# Patient Record
Sex: Female | Born: 1935 | Race: Black or African American | Hispanic: No
Health system: Southern US, Community
[De-identification: ages and names within clinical notes are randomized; demographics above are authoritative.]

---

## 2004-07-15 ENCOUNTER — Ambulatory Visit: Payer: Self-pay | Admitting: Gastroenterology

## 2004-10-23 ENCOUNTER — Emergency Department: Payer: Self-pay | Admitting: Unknown Physician Specialty

## 2004-12-09 ENCOUNTER — Emergency Department: Payer: Self-pay | Admitting: Emergency Medicine

## 2005-03-10 ENCOUNTER — Inpatient Hospital Stay: Payer: Self-pay | Admitting: Internal Medicine

## 2005-03-10 ENCOUNTER — Other Ambulatory Visit: Payer: Self-pay

## 2005-03-11 ENCOUNTER — Other Ambulatory Visit: Payer: Self-pay

## 2005-10-03 ENCOUNTER — Emergency Department: Payer: Self-pay | Admitting: Emergency Medicine

## 2005-12-24 ENCOUNTER — Ambulatory Visit: Payer: Self-pay | Admitting: Family Medicine

## 2006-02-24 ENCOUNTER — Emergency Department: Payer: Self-pay | Admitting: Emergency Medicine

## 2006-09-02 ENCOUNTER — Other Ambulatory Visit: Payer: Self-pay

## 2006-09-02 ENCOUNTER — Inpatient Hospital Stay: Payer: Self-pay | Admitting: Internal Medicine

## 2006-09-06 ENCOUNTER — Other Ambulatory Visit: Payer: Self-pay

## 2007-04-01 ENCOUNTER — Inpatient Hospital Stay: Payer: Self-pay | Admitting: Internal Medicine

## 2007-04-01 ENCOUNTER — Other Ambulatory Visit: Payer: Self-pay

## 2007-05-29 ENCOUNTER — Ambulatory Visit: Payer: Self-pay

## 2008-07-06 ENCOUNTER — Ambulatory Visit: Payer: Self-pay

## 2008-09-11 IMAGING — CT CT HEAD WITHOUT CONTRAST
2 series · 15 of 30 positions shown, 19 images · non-contrast
Comparison: none

REASON FOR EXAM: Metal stus decline
COMMENTS:

[Series 2: without · axial · non-contrast · 0.39mm/px · z∈[+42,+162]mm · 13 of 29 slices shown, 17 images]
[im 3/29  brain]
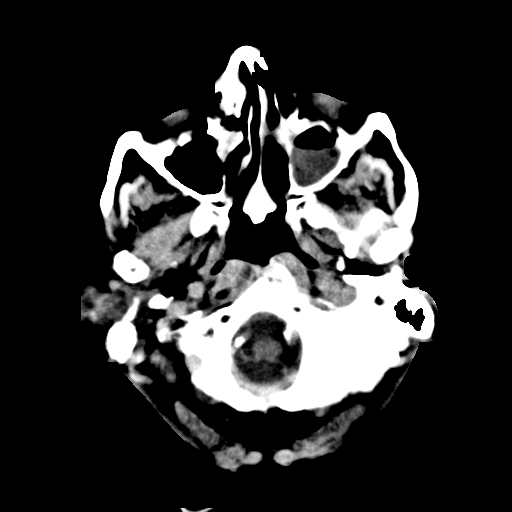
[im 3/29  bone]
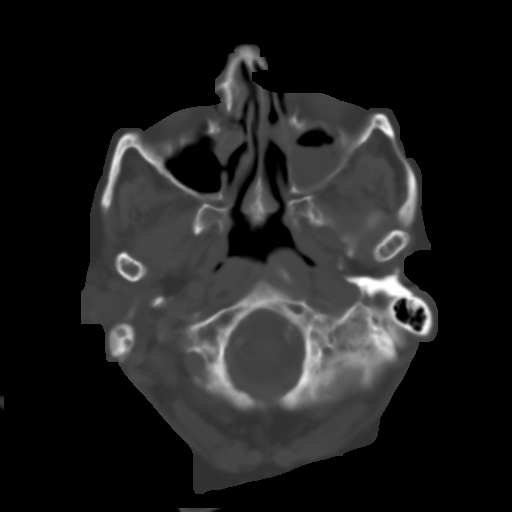
[im 5/29  brain]
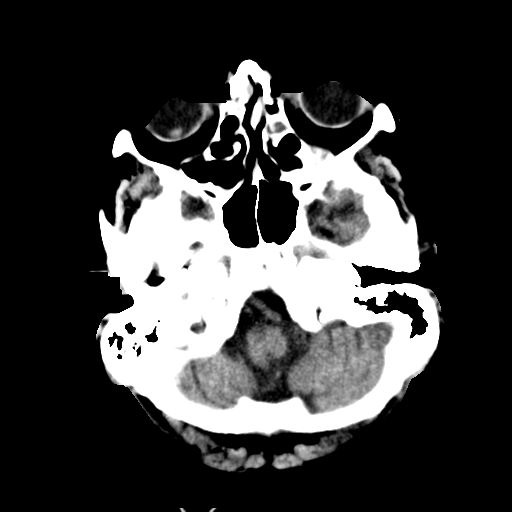
[im 7/29  brain]
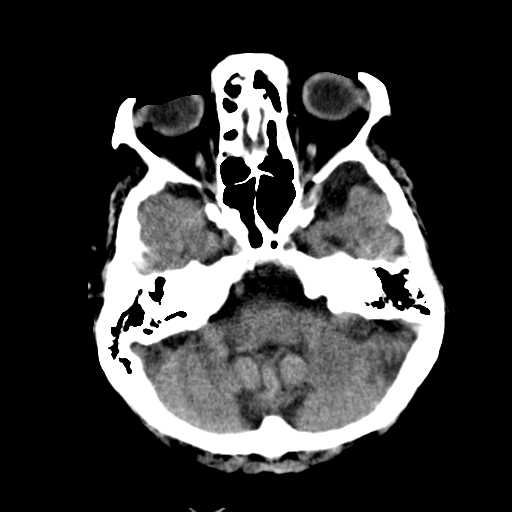
[im 9/29  brain]
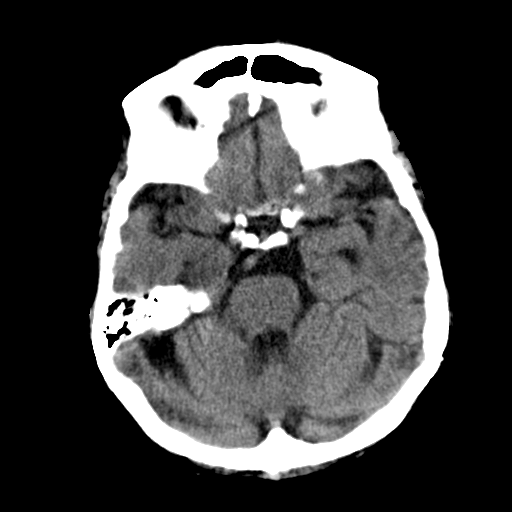
[im 11/29  brain]
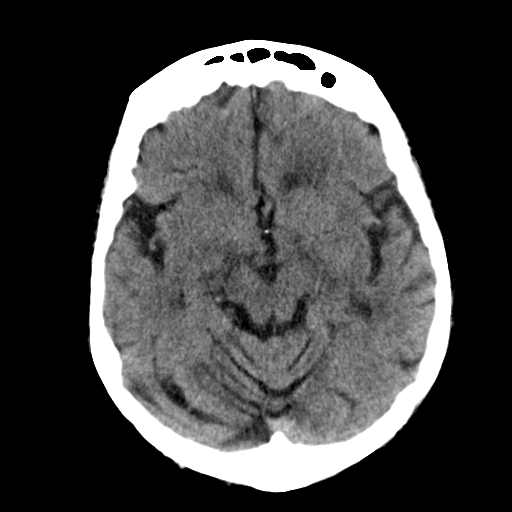
[im 11/29  bone]
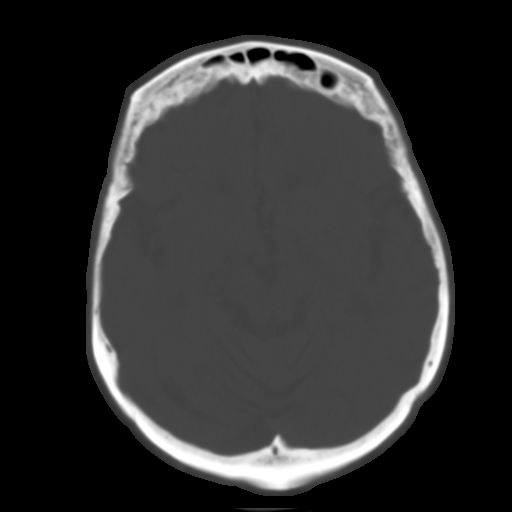
[im 13/29  brain]
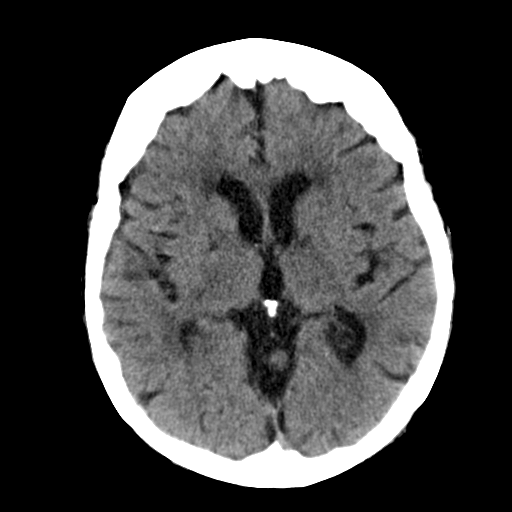
[im 15/29  brain]
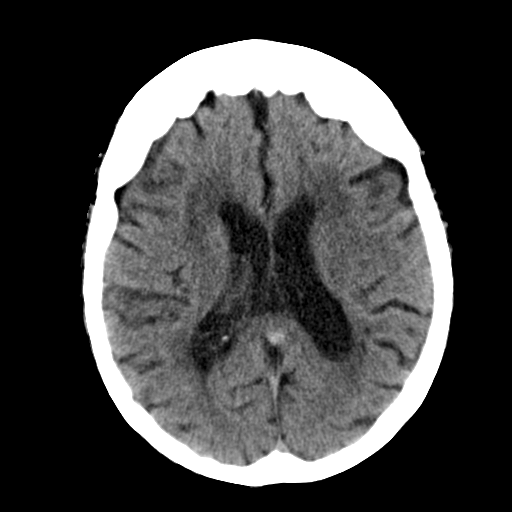
[im 17/29  brain]
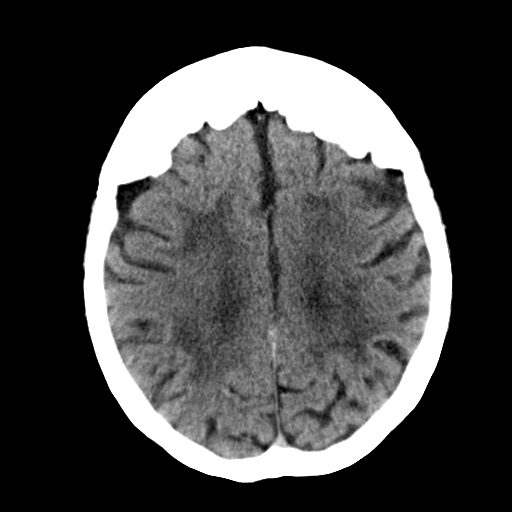
[im 19/29  brain]
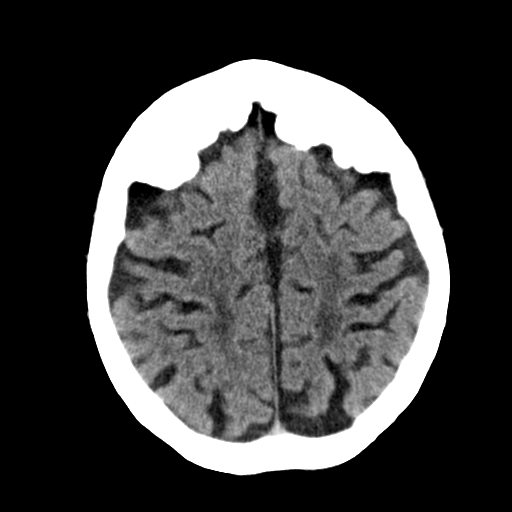
[im 19/29  bone]
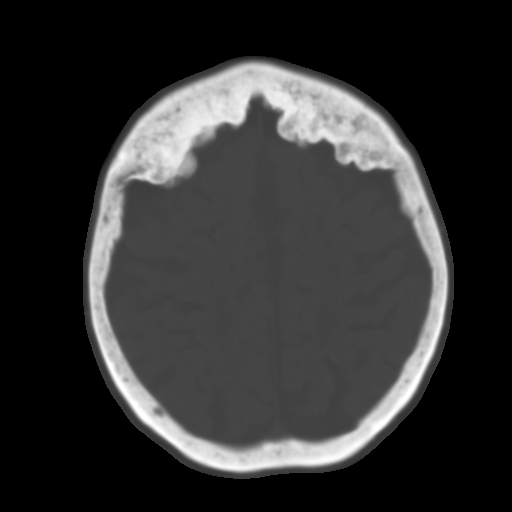
[im 21/29  brain]
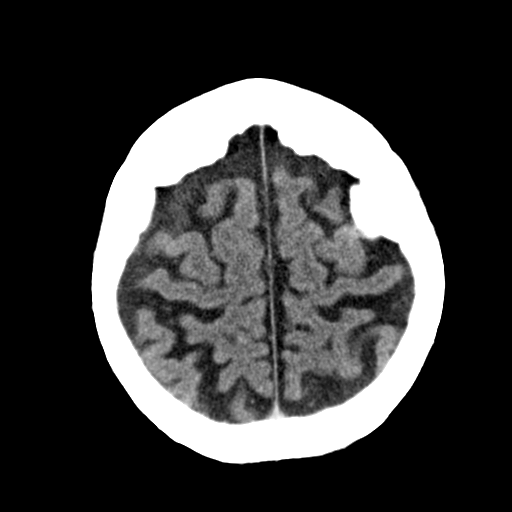
[im 23/29  brain]
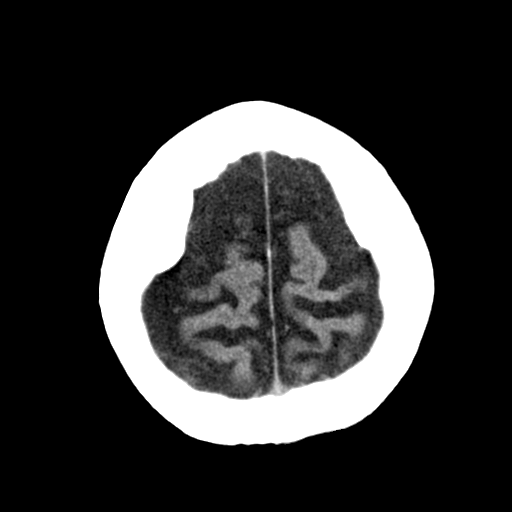
[im 25/29  brain]
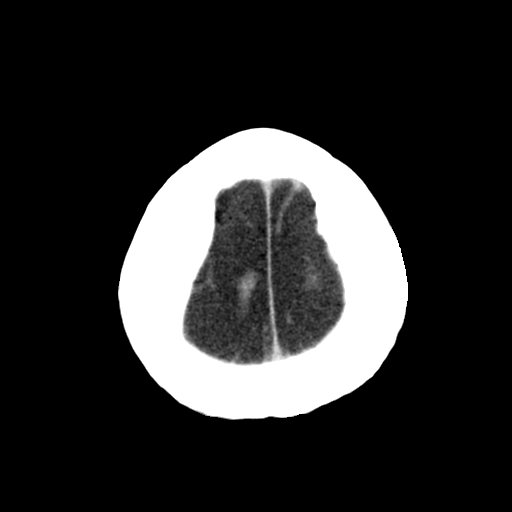
[im 27/29  brain]
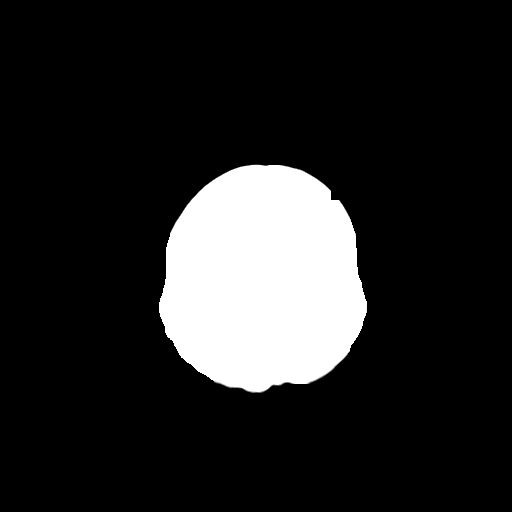
[im 27/29  bone]
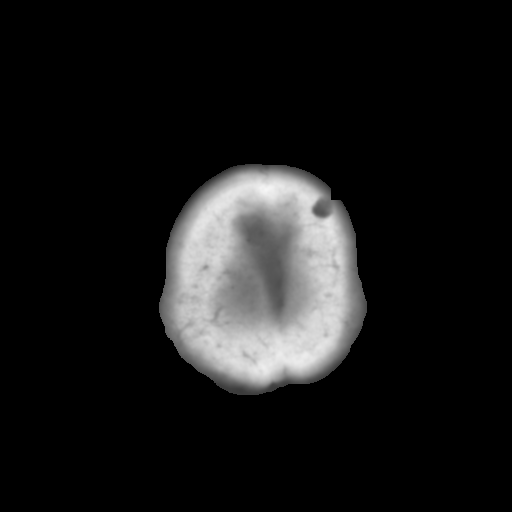

[Series 3: bone · axial · 0.39mm/px · z∈[+42,+62]mm · 2 of 29 slices shown]
[im 3/29  bone]
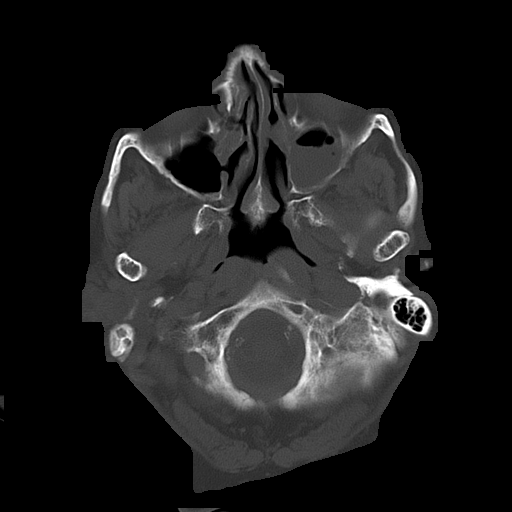
[im 7/29  bone]
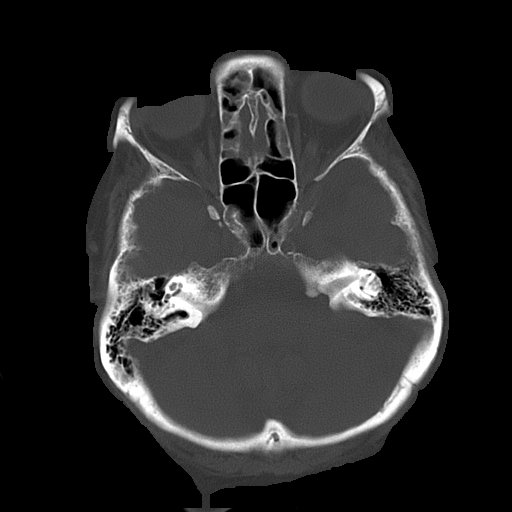

[15 of 30 positions shown; findings below may reference images not displayed]

PROCEDURE:     CT  - CT HEAD WITHOUT CONTRAST  - April 04, 2007  [DATE]

RESULT:     Noncontrast images of the head were obtained. There is no
evidence of intraaxial or extraaxial fluid collections or evidence of acute
hemorrhage. Diffuse cortical atrophy is appreciated. Areas of low
attenuation project within the subcortical deep and periventricular white
matter regions. Focal areas of low attenuation project within the medial
posterior portion of the basal ganglia on the LEFT and in a similar location
on the RIGHT. There is also evidence of cerebellar atrophy. Evaluation of
the visualized bony skeleton demonstrates no evidence of fracture or
dislocation. An air-fluid level is identified within the LEFT maxillary
sinus as well as areas of opacification in the ethmoid sinuses on the LEFT
and mucoperiosteal thickening within the nasal cavity.
IMPRESSION: 1.     Involutional changes.
2.     Areas of chronic infarction within the basal ganglia on the RIGHT and
LEFT.
3.     Findings which raise the suspicion of sinusitis involving the ethmoid
and maxillary sinuses on the LEFT.

## 2010-03-08 ENCOUNTER — Ambulatory Visit: Payer: Self-pay

## 2010-03-09 ENCOUNTER — Ambulatory Visit: Payer: Self-pay | Admitting: Geriatric Medicine

## 2011-09-22 ENCOUNTER — Ambulatory Visit: Payer: Self-pay | Admitting: Internal Medicine

## 2011-10-17 ENCOUNTER — Inpatient Hospital Stay: Payer: Self-pay | Admitting: Internal Medicine

## 2011-10-17 LAB — COMPREHENSIVE METABOLIC PANEL
Albumin: 3.4 g/dL (ref 3.4–5.0)
Alkaline Phosphatase: 80 U/L (ref 50–136)
Bilirubin,Total: 0.5 mg/dL (ref 0.2–1.0)
Calcium, Total: 9 mg/dL (ref 8.5–10.1)
Chloride: 106 mmol/L (ref 98–107)
EGFR (African American): 52 — ABNORMAL LOW
EGFR (Non-African Amer.): 43 — ABNORMAL LOW
Glucose: 111 mg/dL — ABNORMAL HIGH (ref 65–99)
Osmolality: 304 (ref 275–301)
Potassium: 4.1 mmol/L (ref 3.5–5.1)
SGPT (ALT): 20 U/L
Sodium: 145 mmol/L (ref 136–145)

## 2011-10-17 LAB — CBC
MCH: 28.3 pg (ref 26.0–34.0)
MCHC: 32.9 g/dL (ref 32.0–36.0)
MCV: 86 fL (ref 80–100)
Platelet: 150 10*3/uL (ref 150–440)

## 2011-10-17 LAB — URINALYSIS, COMPLETE
Glucose,UR: NEGATIVE mg/dL (ref 0–75)
Nitrite: NEGATIVE
Protein: NEGATIVE
RBC,UR: 63 /HPF (ref 0–5)
Specific Gravity: 1.009 (ref 1.003–1.030)
WBC UR: 365 /HPF (ref 0–5)

## 2011-10-17 LAB — PROTIME-INR
INR: 1.2
Prothrombin Time: 15.4 secs — ABNORMAL HIGH (ref 11.5–14.7)

## 2011-10-17 LAB — CK TOTAL AND CKMB (NOT AT ARMC): CK-MB: 3.5 ng/mL (ref 0.5–3.6)

## 2011-10-18 LAB — CBC WITH DIFFERENTIAL/PLATELET
Basophil #: 0 10*3/uL (ref 0.0–0.1)
Basophil %: 0.3 %
Eosinophil #: 0.1 10*3/uL (ref 0.0–0.7)
HCT: 34.1 % — ABNORMAL LOW (ref 35.0–47.0)
HGB: 11.2 g/dL — ABNORMAL LOW (ref 12.0–16.0)
Lymphocyte %: 17.5 %
MCHC: 32.9 g/dL (ref 32.0–36.0)
Monocyte %: 9.4 %
Neutrophil #: 5.5 10*3/uL (ref 1.4–6.5)
Platelet: 140 10*3/uL — ABNORMAL LOW (ref 150–440)
RDW: 14.4 % (ref 11.5–14.5)
WBC: 7.8 10*3/uL (ref 3.6–11.0)

## 2011-10-18 LAB — BASIC METABOLIC PANEL
Anion Gap: 12 (ref 7–16)
BUN: 43 mg/dL — ABNORMAL HIGH (ref 7–18)
Co2: 23 mmol/L (ref 21–32)
Creatinine: 1.12 mg/dL (ref 0.60–1.30)
EGFR (African American): 60 — ABNORMAL LOW
EGFR (Non-African Amer.): 49 — ABNORMAL LOW
Glucose: 84 mg/dL (ref 65–99)
Osmolality: 295 (ref 275–301)
Potassium: 4 mmol/L (ref 3.5–5.1)

## 2011-10-18 LAB — CK TOTAL AND CKMB (NOT AT ARMC): CK, Total: 64 U/L (ref 21–215)

## 2011-10-18 LAB — TROPONIN I: Troponin-I: 0.15 ng/mL — ABNORMAL HIGH

## 2011-10-18 LAB — PROTIME-INR
INR: 1.3
Prothrombin Time: 16.6 secs — ABNORMAL HIGH (ref 11.5–14.7)

## 2011-10-19 LAB — URINE CULTURE

## 2011-10-20 LAB — PROTIME-INR: Prothrombin Time: 18 secs — ABNORMAL HIGH (ref 11.5–14.7)

## 2011-10-23 ENCOUNTER — Ambulatory Visit: Payer: Self-pay | Admitting: Internal Medicine

## 2013-02-19 DEATH — deceased

## 2013-03-26 IMAGING — CR DG CHEST 1V PORT
1 series · 1 of 1 positions shown · non-contrast
Comparison: none

REASON FOR EXAM: new onset seizure
COMMENTS:

[portable]
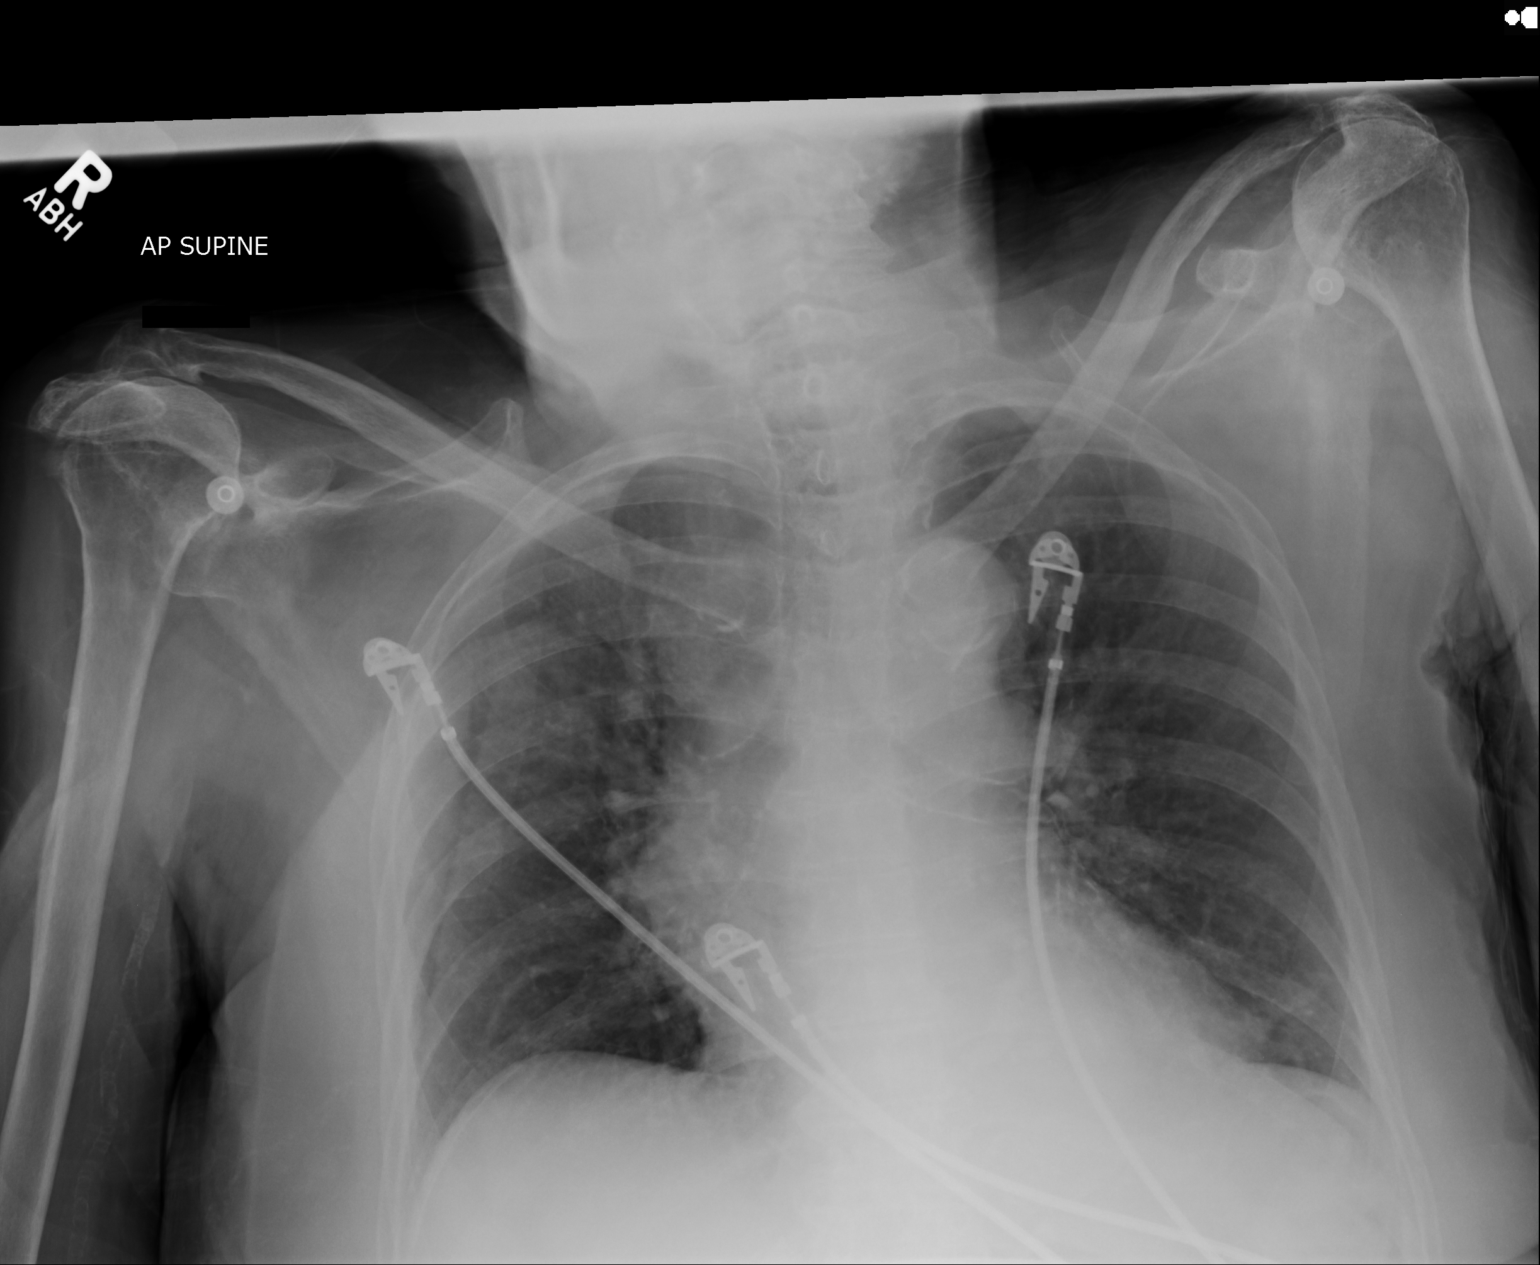

[1 of 1 positions shown; findings below may reference images not displayed]

PROCEDURE:     DXR - DXR PORTABLE CHEST SINGLE VIEW  - October 17, 2011 [DATE]

RESULT:     The patient has taken a shallow inspiration. With technique
taken into consideration, there is an area of increased density that
projects in the right upper lobe. There is also increased density in the
right paratracheal region. The visualized bony skeleton is unremarkable.
IMPRESSION: 1. Shallow inspiration.
2. Atelectasis versus infiltrate right upper lobe.
3. Right paratracheal density. This may be secondary to technique though a
mass or infiltrate cannot be excluded. Repeat evaluation is recommended.

## 2015-01-13 NOTE — Consult Note (Signed)
Comments   Met with Cristobella Rothgeb - pt's daughter-in-law who describes herself as pt's Media planner. Updated her on pt's status. She confirms pt now at baseline with mental status. Family very realistic that pt is likely approaching end of life and is at high risk for recurrent infections. Family wants pt to return to SNF and had explored option of hospice this past week. They are interested in hospice and will order a screening. Family would prefer comfort and end of life. Plan for eventual discharge back to SNF with hospice.  25 minutes  Electronic Signatures for Addendum Section:  Phifer, Izora Gala (MD) (Signed Addendum 28-Jan-13 20:09)  Discussed with with Billey Chang, NP, in detail. Agree with assessment and plan as outlined in above note.   Electronic Signatures: Borders, Kirt Boys (NP)  (Signed 28-Jan-13 10:15)  Authored: Palliative Care   Last Updated: 28-Jan-13 20:09 by Phifer, Izora Gala (MD)

## 2015-01-13 NOTE — H&P (Signed)
PATIENT NAME:  Darlene Warren, BEIL MR#:  [id number] DATE OF BIRTH:  11-24-1924  DATE OF ADMISSION:  10/17/2011  CHIEF COMPLAINT: Seizure.   HISTORY OF PRESENT ILLNESS: Darlene Warren is an 81 year old woman with endstage dementia who lives at [facility] nursing facility. She was in her usual state of health until today when she was observed by staff having what was thought to be a tonic-clonic seizure. The patient is unable to provide any history, and staff is unavailable to give any further information. She has no seizure history. She is accompanied in the Emergency Department by her daughter-in-law and sister who say that she is not quite back to her baseline. Her baseline is nonverbal, right arm tremor, nonambulatory, but typically alert. They have noticed that over the past several months her p.o. intake has declined.   REVIEW OF SYSTEMS: Unobtainable secondary to altered mental status and baseline dementia.   PAST MEDICAL HISTORY:  1. Endstage dementia.  2. Congestive heart failure.  3. Osteoarthritis.  4. Hypertension.   PRIMARY CARE PHYSICIAN: Dr. Caffie Damme   FAMILY HISTORY: Unobtainable secondary to dementia and altered mental status.   SOCIAL HISTORY: She lives at [facility]. No alcohol, tobacco, or illicit drugs.   CODE STATUS: DNR/DNI.   CONTACT PERSON AND POA:   Her sister, Dillon Bjork.  ALLERGIES: No known drug allergies.    MEDICATIONS:  1. Tylenol 325 mg, 2 tablets p.o. q.i.d.  2. Metoprolol 12.5 mg p.o. b.i.d.  3. Warfarin 4.5 mg p.o. at bedtime.  4. Furosemide 20 mg p.o. on Monday, Wednesday, and Friday.  5. Calcium carbonate 500 mg p.o. daily.  6. Aspirin 81 mg p.o. daily.  7. Amlodipine 5 mg p.o. daily   PHYSICAL EXAMINATION:  VITAL SIGNS: On admission, temperature 97.8, pulse 81, respiratory rate 20, blood pressure 148/74, oxygen saturation 95% on room air.   GENERAL: She is well-developed and well-nourished, and is in no acute distress   HEENT: Conjunctivae and lids  are without erythema or pallor. Pupils are equally round and reactive to light, though appear postsurgical. External inspection of the ears and nose is without lesions or masses. Hearing cannot be assessed. Nasal mucosa, septum, and turbinates are without lesions or masses. Lips and gums are without lesions or masses. She is edentulous. Oropharynx is clear. Oral mucosa is moist. Posterior pharynx is not well visualized.   NECK: Supple. There are no masses. Thyroid is not enlarged. There are no nodules.   RESPIRATORY: She is not in respiratory distress.   LUNGS: Lungs are clear throughout with no wheezes or crackles.   CARDIOVASCULAR: Normal S1 and S2. No murmurs, rubs, or gallops. Cardiac sounds are distant. Pedal pulses are 2+ and equal bilaterally.   ABDOMEN: Soft, nontender, nondistended. Liver and spleen are not enlarged Bowel sounds are hypoactive. No cervical or axillary lymphadenopathy.   EXTREMITIES: Digits and nails are without clubbing, cyanosis, or edema. Range of motion and strength cannot be assessed. She does seem to have some lower extremity spasticity.   SKIN: Skin and subcutaneous tissue are without rashes or ulcers. Palpation of skin reveals no induration or nodules. She does have a stage I sacral decubitus ulcer without surrounding erythema.   NEUROLOGIC: Cranial nerves II through XII are grossly intact, though assessment is limited secondary to cooperation. Judgment and insight are poor. The patient is not oriented.  LABORATORY, DIAGNOSTIC, AND RADIOLOGICAL DATA: White blood cell count is 7.9, hematocrit 40, platelets 150, sodium 145, potassium 4.1, chloride 106, bicarbonate 26, BUN  53, creatinine 1.3, which is her baseline. Glucose 111, INR 1.2, MB 3.5. CK is 58, troponin 0.09. CT of the head reveals no acute intracranial process. Chest x-ray is significant for right upper lobe infiltrate versus atelectasis. EKG is normal sinus rhythm with a first-degree AV block, left  ventricular hypertrophy, Q waves inferiorly, and T-wave inversions throughout the anterior lateral leads.   ASSESSMENT AND PLAN: 81 year old woman with endstage dementia who presents for possible witnessed seizure, found to have urinary tract infection and possible aspiration pneumonia.  1. Urinary tract infection: Urine culture is pending. She did receive a dose of ciprofloxacin in the Emergency Department, switched over to Keflex secondary to being on warfarin.  2. Aspiration pneumonia:  She did get a dose of azithromycin in the Emergency Department which will not be continued on the floor. She is on aspiration precautions and a swallow evaluation is ordered. Empirically ordered for a pureed diet. Chest x-ray read recommends followup to rule out occult malignancy, will not order given goals of care.  3. Possible witnessed seizure: She has no history of seizures, but she does have risk factors including prior stroke. We will not empirically treat with antiepileptics. Head CT is negative for acute process. We will continue to monitor.  4. Endstage dementia: She is DO NOT RESUSCITATE. Goals of care are comfort per family.  5. Troponinemia : This may be secondary to renal failure. We will trend her cardiac enzymes.  6. Chronic renal insufficiency: We will give a 500-mL bolus as she does look slightly dry, though her creatinine is baseline.  7. Deep vein thrombosis prophylaxis is per her Coumadin.   TOTAL TIME SPENT ON CARE AND COORDINATION: 45 minutes.   ____________________________ Lise AuerJennifer L. Manson PasseyBrown, MD jlb:bjt D:  10/17/2011 16:41:30 ET           T:  10/18/2011 08:23:52 ET        JOB#: [id number]  cc: Kerrin ChampagneJoan E. Caffie DammeEast, MD Eber HongJENNIFER L BROWN MD ELECTRONICALLY SIGNED 10/20/2011 13:37

## 2015-01-13 NOTE — Discharge Summary (Signed)
PATIENT NAME:  Darlene Warren, [name] MR#:  [id number] DATE OF BIRTH:  April 02, 1925  DATE OF ADMISSION:  10/17/2011 DATE OF DISCHARGE:  10/20/2011  DISCHARGE DIAGNOSES:  1. Altered mental status/metabolic encephalopathy, now at her baseline. Negative head computed tomography.  2. Extended-spectrum beta-lactamase urinary tract infection on Levaquin, day two out of 5 in isolation.  3. Questionable aspiration pneumonia present on admission which is ruled out now.  4. Possible witnessed seizure while at home; none reported here. She does have risk factor of prior stroke. She appeared to be at baseline. Will hold off antiepileptics at this time.  5. End-stage dementia, now under hospice.  6. Elevated troponin, likely due to supply/demand ischemia.  7. Dehydration with elevated BUN, now improved with hydration.  8. Hypertension, well controlled on medication.  9. Stage II pressure ulcer on the right buttock (1 cm x 0.5 cm in size). Dressing changes recommended. No signs of infection, well healing.   SECONDARY DIAGNOSES:  1. End-stage dementia.  2. History of hypertension.  3. Osteoarthritis.   CONSULTATION: Palliative care, Dr. Harvie JuniorPhifer.   LABORATORY, DIAGNOSTIC AND RADIOLOGICAL DATA:  CT scan of the head without contrast on 01/26 showed no acute abnormalities.   Chest x-ray on 01/26 showed atelectasis versus infiltrate in the right upper lobe. Right paratracheal density.   Urinalysis on admission showed 365 WBCs, 1+ bacteria, 3+ leukocyte esterase. Urine culture grew more than 100,000 colonies of ESBL Escherichia coli.   HISTORY AND SHORT HOSPITAL COURSE: Patient is an 81 year old female with above-mentioned medical problems was admitted for altered mental status/encephalopathy which was thought to be secondary to urinary tract infection. Her urine culture was growing ESBL Escherichia coli. She was also thought to have possible aspiration pneumonia which was subsequently ruled out as she did not have any  upper respiratory tract infection symptoms. There was also concern for possible witnessed seizure although considering her age and comorbid medical condition with end-stage dementia she was not considered for any antiepileptic medication. After discussion with patient's family member, palliative care consult was obtained and patient was considered for hospice due to her end-stage dementia and comorbid medical condition as she wanted comfort care only. I did talk with patient's daughter-in-law, Darlene Warren, on the phone and she is in agreement with the [facility] as she is qualified for hospice. Her mental status was back to baseline and she is being discharged in fair condition back to her skilled nursing facility.   PHYSICAL EXAMINATION: VITAL SIGNS: On the date of discharge her vital signs are as follows: Temperature 98.6, heart rate 76 per minute, respirations 20 per minute, blood pressure 145/81 mmHg. She is saturating 95% to 97% on room air. Pertinent Physical Examination: CARDIOVASCULAR: S1, S2 normal. No murmur, rubs, or gallop. LUNGS: Clear to auscultation bilaterally. No wheezing, rales, rhonchi, crepitation. ABDOMEN: Soft, benign. NEUROLOGIC: Nonfocal examination. She does have end-stage dementia and usually mumbles in response to most of the questions although she is quite awake on the date of discharge. All other physical examination remained at the baseline.   DISCHARGE MEDICATIONS:  1. Norvasc 2.5 mg p.o. daily.  2. Aricept 5 mg p.o. at bedtime.  3. Lisinopril 5 mg p.o. daily.  4. Heparin 5000 units subcutaneous every 12 hours until patient is ambulatory for deep vein thrombosis prophylaxis.  5. Prilosec 20 mg p.o. daily.  6. Toprol-XL 25 mg p.o. daily.  7. Tylenol 650 mg p.o. every four hours as needed.  8. Aspirin 325 mg p.o. daily.  9. Levaquin 250 mg p.o. daily for three more days.  10. Ensure 3 times a day.   DISCHARGE  DIET: Low sodium, puree with thin liquids, medication to be crushed in puree. Strict aspiration precautions as per speech therapy and has been listed in the discharge instructions and explained to the family members.   DISCHARGE INSTRUCTIONS AND FOLLOW UP: Patient will need follow up with her primary care physician, Dr. Benito Mccreedy, at the facility in one week. She will be followed by hospice while at [facility].  TOTAL TIME DISCHARGING THIS PATIENT: 55 minutes.   ____________________________ Ellamae Sia. Sherryll Burger, MD vss:cms D: 10/20/2011 11:31:21 ET T: 10/20/2011 12:01:10 ET JOB#: [id number]  cc: Anecia Nusbaum S. Sherryll Burger, MD, <Dictator> Glenetta Borg, MD Ned Grace, MD  Ellamae Sia [facility] MD ELECTRONICALLY SIGNED 10/20/2011 17:04
